# Patient Record
Sex: Male | Born: 1990 | Race: Black or African American | Hispanic: No | Marital: Single | Smoking: Never smoker
Health system: Southern US, Community
[De-identification: ages and names within clinical notes are randomized; demographics above are authoritative.]

## PROBLEM LIST (undated history)

## (undated) ENCOUNTER — Emergency Department (HOSPITAL_COMMUNITY): Discharge status: Against Medical Advice | Payer: Self-pay

## (undated) DIAGNOSIS — R52 Pain, unspecified: Secondary | ICD-10-CM

## (undated) DIAGNOSIS — R51 Headache: Secondary | ICD-10-CM

## (undated) DIAGNOSIS — C8590 Non-Hodgkin lymphoma, unspecified, unspecified site: Secondary | ICD-10-CM

## (undated) DIAGNOSIS — G8929 Other chronic pain: Secondary | ICD-10-CM

## (undated) HISTORY — PX: CHOLECYSTECTOMY: SHX55

---
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
## 2015-12-18 ENCOUNTER — Emergency Department
Admission: EM | Admit: 2015-12-18 | Discharge: 2015-12-18 | Disposition: A | Discharge status: Other Acute Inpatient Hospital | Payer: Commercial Managed Care - HMO | Source: Home / Self Care

## 2015-12-18 ENCOUNTER — Encounter: Payer: Self-pay | Admitting: Emergency Medicine

## 2015-12-18 HISTORY — DX: Pain, unspecified: R52

## 2015-12-18 HISTORY — DX: Non-Hodgkin lymphoma, unspecified, unspecified site: C85.90

## 2015-12-18 HISTORY — DX: Other chronic pain: G89.29

## 2015-12-18 HISTORY — DX: Headache: R51

## 2015-12-18 NOTE — ED Notes (Signed)
Reports left shoulder began hurting yesterday; no injury or unusual motions; father confirms that patient is home all day without access to others/injury possibilities. Has GI problems, so did not take pain med.
